# Patient Record
Sex: Female | Born: 1965 | Race: Black or African American | Hispanic: No | Marital: Single
Health system: Southern US, Community
[De-identification: ages and names within clinical notes are randomized; demographics above are authoritative.]

---

## 2014-01-06 ENCOUNTER — Other Ambulatory Visit (HOSPITAL_COMMUNITY): Payer: Self-pay | Admitting: Internal Medicine

## 2014-01-06 ENCOUNTER — Other Ambulatory Visit: Payer: Self-pay | Admitting: Internal Medicine

## 2014-01-06 DIAGNOSIS — N186 End stage renal disease: Secondary | ICD-10-CM

## 2014-01-07 ENCOUNTER — Ambulatory Visit (HOSPITAL_COMMUNITY)
Admission: RE | Admit: 2014-01-07 | Discharge: 2014-01-07 | Disposition: A | Discharge status: Home / Self Care | Payer: 59 | Source: Ambulatory Visit | Attending: Internal Medicine | Admitting: Internal Medicine

## 2014-01-07 DIAGNOSIS — Z930 Tracheostomy status: Secondary | ICD-10-CM | POA: Insufficient documentation

## 2014-01-07 DIAGNOSIS — N186 End stage renal disease: Secondary | ICD-10-CM | POA: Insufficient documentation

## 2014-01-07 DIAGNOSIS — Z0389 Encounter for observation for other suspected diseases and conditions ruled out: Secondary | ICD-10-CM | POA: Diagnosis not present

## 2014-01-07 DIAGNOSIS — T82898A Other specified complication of vascular prosthetic devices, implants and grafts, initial encounter: Secondary | ICD-10-CM | POA: Diagnosis present

## 2014-01-07 MED ORDER — IOHEXOL 300 MG/ML  SOLN
100.0000 mL | Freq: Once | INTRAMUSCULAR | Status: AC | PRN
  Administered 2014-01-07: 43 mL via INTRAVENOUS

## 2014-01-07 NOTE — Progress Notes (Signed)
Pt to IR for procedure, placed on same vent settings as when at [facility]. Pt tolerating vent settings. RT will continue to monitor.

## 2014-01-07 NOTE — Progress Notes (Signed)
Allergies- Sulfa, Naproxen, Clonidine, Biaxin, Tramadol-

## 2014-01-20 ENCOUNTER — Other Ambulatory Visit (HOSPITAL_COMMUNITY): Payer: Self-pay | Admitting: Internal Medicine

## 2014-01-20 DIAGNOSIS — T82590A Other mechanical complication of surgically created arteriovenous fistula, initial encounter: Secondary | ICD-10-CM

## 2014-01-20 DIAGNOSIS — T82590S Other mechanical complication of surgically created arteriovenous fistula, sequela: Secondary | ICD-10-CM

## 2014-01-21 ENCOUNTER — Ambulatory Visit (HOSPITAL_COMMUNITY)
Admission: RE | Admit: 2014-01-21 | Discharge: 2014-01-21 | Disposition: A | Discharge status: Home / Self Care | Payer: 59 | Source: Ambulatory Visit | Attending: Internal Medicine | Admitting: Internal Medicine

## 2014-01-21 DIAGNOSIS — Z9911 Dependence on respirator [ventilator] status: Secondary | ICD-10-CM | POA: Diagnosis not present

## 2014-01-21 DIAGNOSIS — T82590A Other mechanical complication of surgically created arteriovenous fistula, initial encounter: Secondary | ICD-10-CM

## 2014-01-21 DIAGNOSIS — T82898A Other specified complication of vascular prosthetic devices, implants and grafts, initial encounter: Secondary | ICD-10-CM | POA: Diagnosis present

## 2014-01-21 DIAGNOSIS — Z0389 Encounter for observation for other suspected diseases and conditions ruled out: Secondary | ICD-10-CM | POA: Diagnosis not present

## 2014-01-21 DIAGNOSIS — N186 End stage renal disease: Secondary | ICD-10-CM | POA: Insufficient documentation

## 2014-01-21 MED ORDER — IOHEXOL 300 MG/ML  SOLN
100.0000 mL | Freq: Once | INTRAMUSCULAR | Status: AC | PRN
  Administered 2014-01-21: 40 mL via INTRAVENOUS

## 2014-01-21 NOTE — Sedation Documentation (Signed)
Dr Miles CostainShick review films, no intervention needed. [facility] called for pick up.  Pt tolerated well, VSS at this time.

## 2014-01-21 NOTE — Procedures (Signed)
Successful LUE AVFistulogram No comp Stable appearance Full report in pacs

## 2014-01-21 NOTE — Progress Notes (Signed)
RT note-Placed on Servoi ventilator with ventilator setting given in report from [facility]. SIMV-10 peep-10, VT 450, PS-20, placed on 100% for procedure. BBS with rhonchi, no suctioning required at this time. Continue to monitor.

## 2015-07-25 IMAGING — XA IR SHUNTOGRAM/ FISTULAGRAM
1 series · 13 of 22 positions shown · non-contrast
Comparison: none

CLINICAL DATA: End-stage renal disease, decrease access flow rates

[Series 1: run · 13 of 22 slices shown]
[im 1/22]
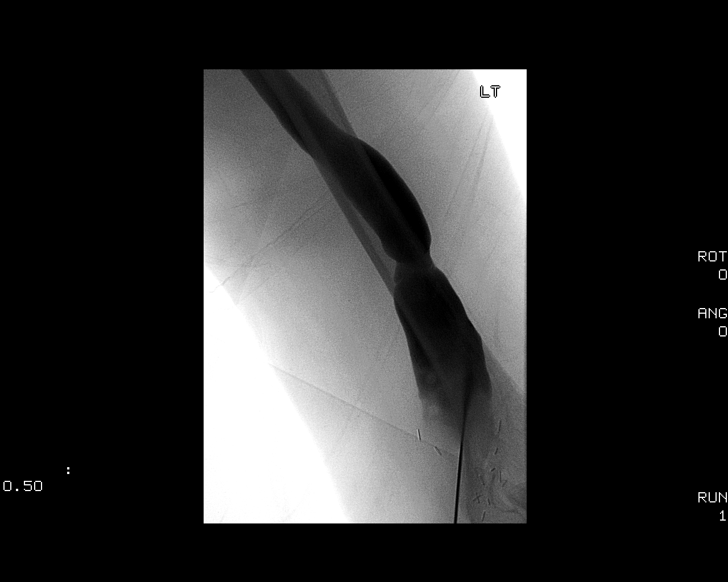
[im 3/22]
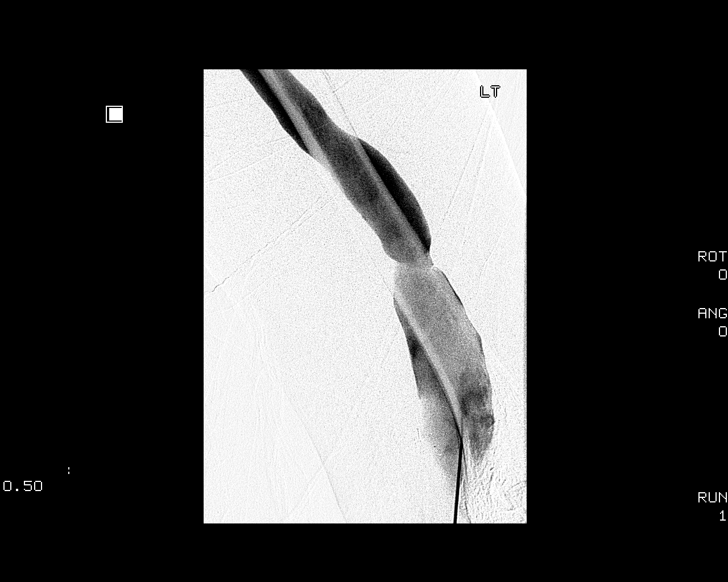
[im 5/22]
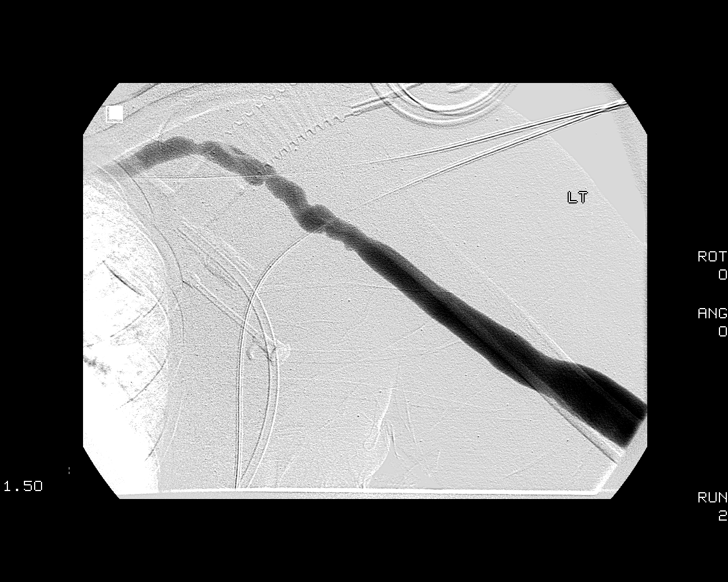
[im 6/22]
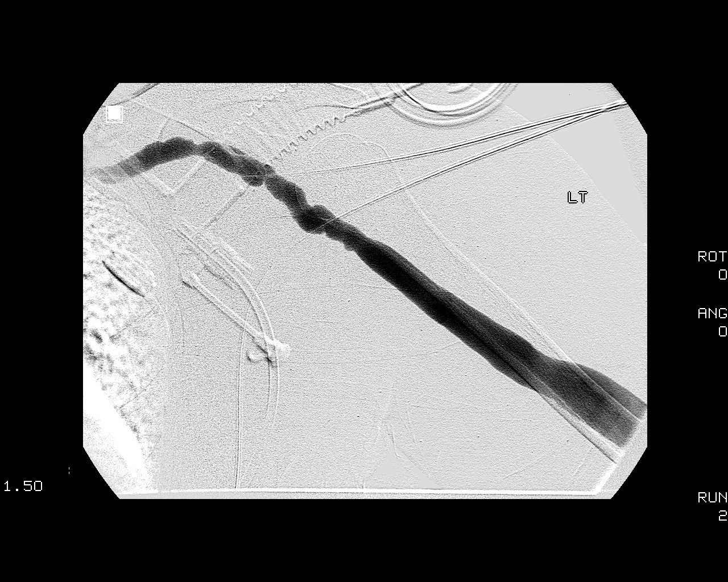
[im 8/22]
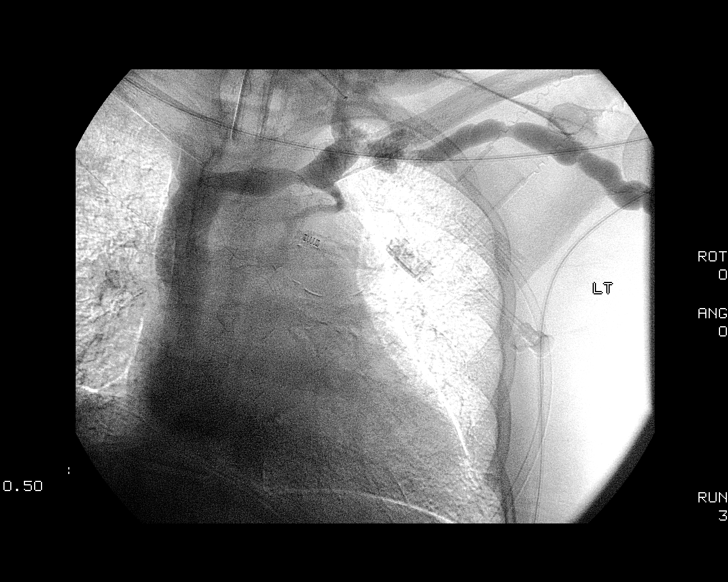
[im 10/22]
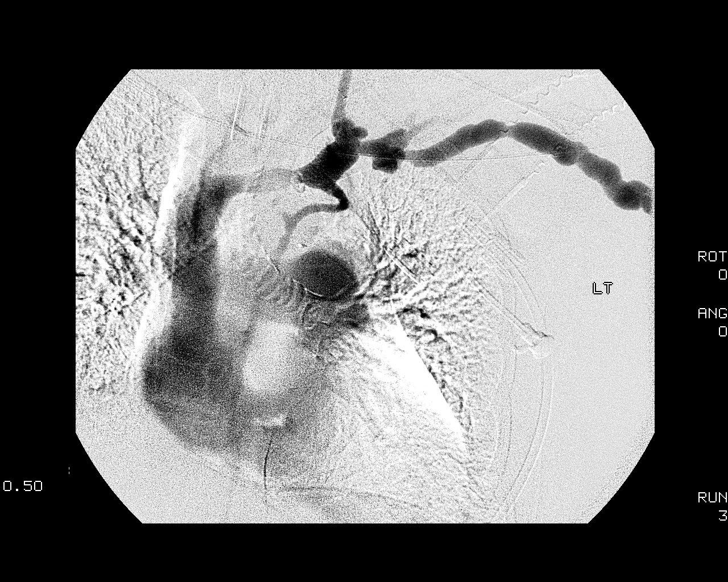
[im 12/22]
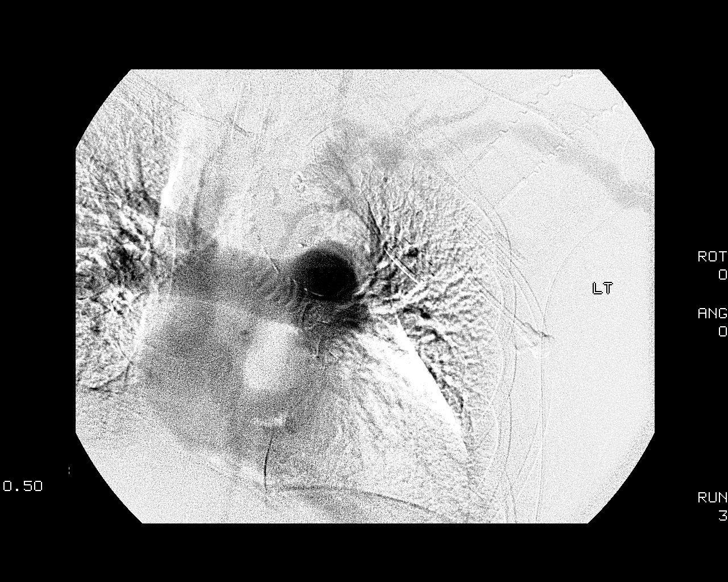
[im 13/22]
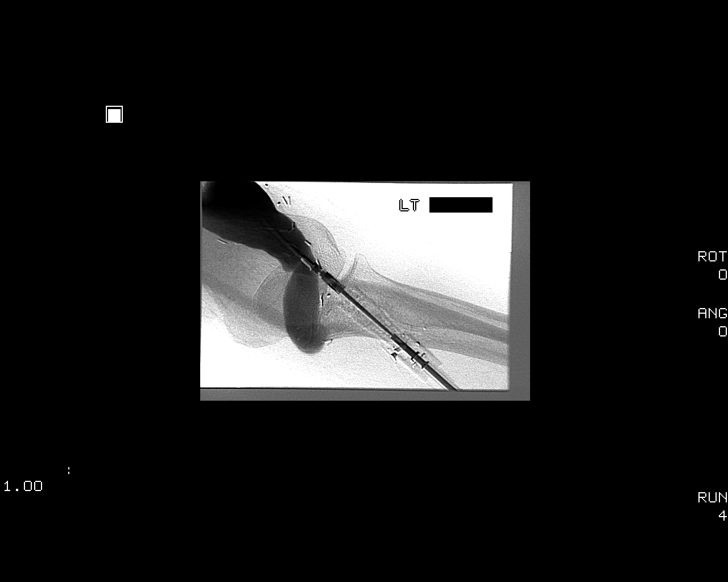
[im 15/22]
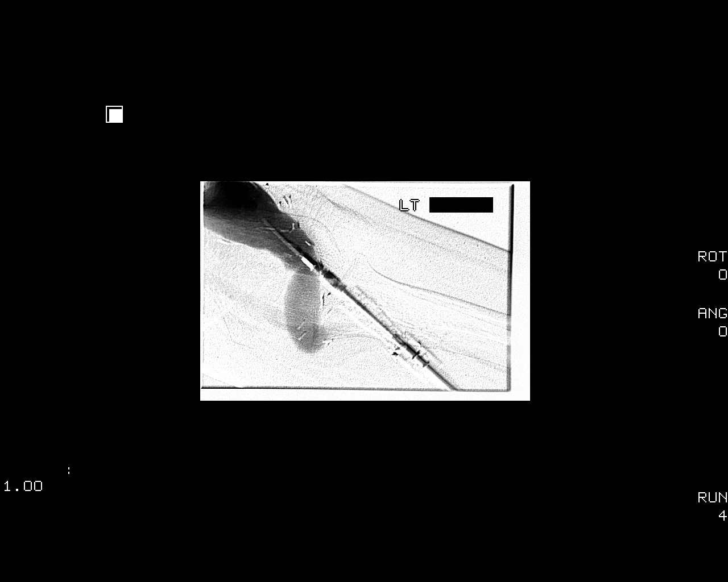
[im 17/22]
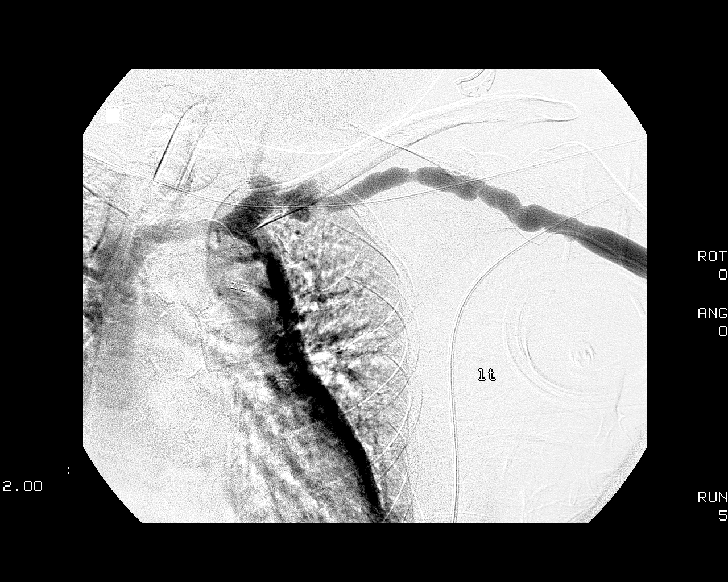
[im 18/22]
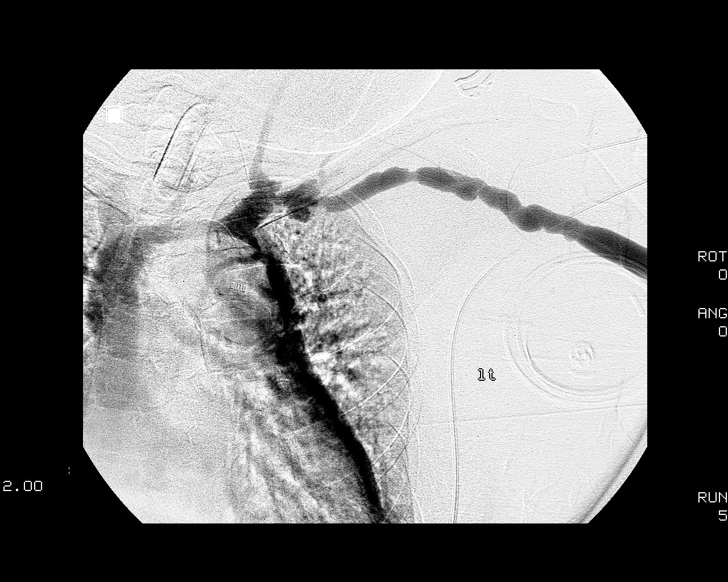
[im 20/22]
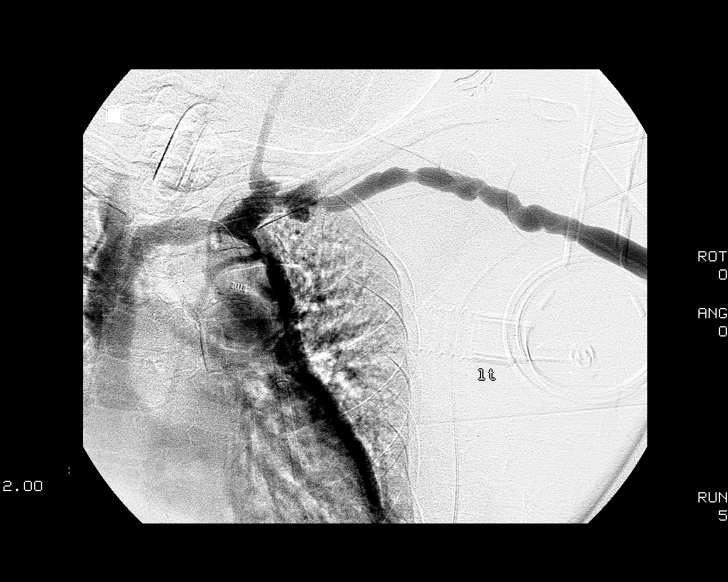
[im 22/22]
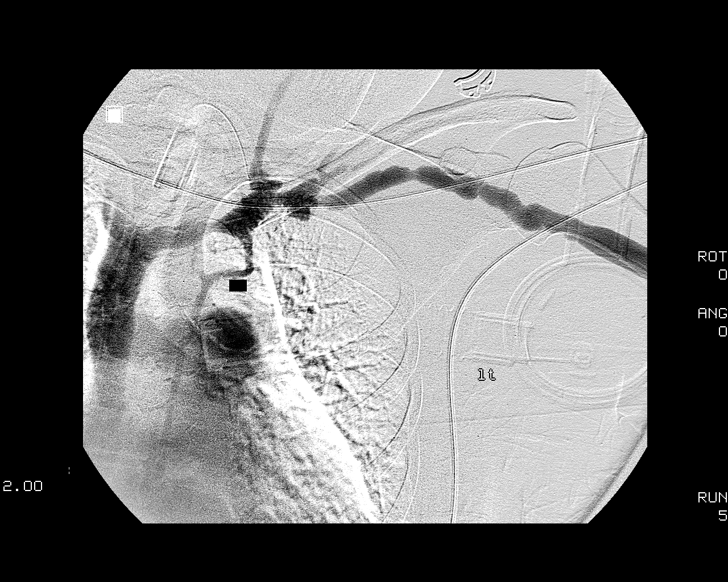

[13 of 22 positions shown; findings below may reference images not displayed]

EXAM:
LEFT UPPER EXTREMITY AV FISTULA

Radiologist:  Maakabi, Kedisaletse Lydia

Guidance:  Fluoroscopic

FLUOROSCOPY TIME:  1 min 36 seconds

MEDICATIONS AND MEDICAL HISTORY:
None.

ANESTHESIA/SEDATION:
None.

CONTRAST:  40mL OMNIPAQUE IOHEXOL 300 MG/ML  SOLN

COMPLICATIONS:
No immediate

PROCEDURE:
Informed consent was obtained from the patient following explanation
of the procedure, risks, benefits and alternatives. The patient
understands, agrees and consents for the procedure. All questions
were addressed. A time out was performed.

Maximal barrier sterile technique utilized including caps, mask,
sterile gowns, sterile gloves, large sterile drape, hand hygiene,
and Betadine.

Under sterile conditions, the left upper arm AV fistula was accessed
with an 18 gauge Angiocath. Contrast injection performed for a
complete fistulogram from the anastomosis to the right atrium.

Left fistulogram: Arterial anastomosis to the brachial artery at the
elbow is patent. Stable aneurysmal dilatation of the outflow
cephalic vein in the mid humerus region. No significant peripheral
stenosis demonstrated or competing collateral branch. More
centrally, there are similar stable Minor irregular luminal
narrowings of the cephalic vein across the shoulder, at the cephalic
subclavian junction and extending into the subclavian and innominate
veins. These are unchanged compared to the prior study. No
significant flow limiting abnormality or central occlusion. There is
brisk central venous inflow to the patent SVC.
IMPRESSION: Patent left upper extremity AV fistula. Stable tandem mild luminal
narrowings of the central cephalic vein, subclavian and innominate
veins without significant flow limitation.
